# Patient Record
Sex: Male | Born: 1965 | Race: Black or African American | Hispanic: No | Marital: Married | Smoking: Current every day smoker
Health system: Southern US, Community
[De-identification: ages and names within clinical notes are randomized; demographics above are authoritative.]

## PROBLEM LIST (undated history)

## (undated) HISTORY — PX: SHOULDER SURGERY: SHX246

## (undated) HISTORY — PX: HERNIA REPAIR: SHX51

---

## 2017-06-22 ENCOUNTER — Encounter: Payer: Self-pay | Admitting: Emergency Medicine

## 2017-06-22 ENCOUNTER — Ambulatory Visit (INDEPENDENT_AMBULATORY_CARE_PROVIDER_SITE_OTHER): Payer: Self-pay

## 2017-06-22 ENCOUNTER — Ambulatory Visit
Admission: EM | Admit: 2017-06-22 | Discharge: 2017-06-22 | Disposition: A | Discharge status: Home / Self Care | Payer: Self-pay | Attending: Family Medicine | Admitting: Family Medicine

## 2017-06-22 DIAGNOSIS — L089 Local infection of the skin and subcutaneous tissue, unspecified: Secondary | ICD-10-CM

## 2017-06-22 DIAGNOSIS — M79671 Pain in right foot: Secondary | ICD-10-CM

## 2017-06-22 DIAGNOSIS — S90121A Contusion of right lesser toe(s) without damage to nail, initial encounter: Secondary | ICD-10-CM

## 2017-06-22 MED ORDER — SULFAMETHOXAZOLE-TRIMETHOPRIM 800-160 MG PO TABS: 1.0000 | ORAL_TABLET | Freq: Two times a day (BID) | ORAL | 0 refills | Status: AC

## 2017-06-22 MED ORDER — MELOXICAM 15 MG PO TABS: 15.0000 mg | ORAL_TABLET | Freq: Every day | ORAL | 0 refills | Status: AC | PRN

## 2017-06-22 MED ORDER — TRAMADOL HCL 50 MG PO TABS: 50.0000 mg | ORAL_TABLET | Freq: Three times a day (TID) | ORAL | 0 refills | Status: AC | PRN

## 2017-06-22 MED ORDER — MUPIROCIN 2 % EX OINT: TOPICAL_OINTMENT | CUTANEOUS | 0 refills | Status: AC

## 2017-06-22 NOTE — ED Provider Notes (Addendum)
[facility] ____________________________________________  Time seen: Approximately 5:38 PM  I have reviewed the triage vital signs and the nursing notes.   HISTORY  Chief Complaint Foot Pain (right foot)   HPI Carlos Paul is a 51 y.o. male  present for evaluation of right third fourth and fifth toe pain. Patient reports approximate 3-4 weeks ago he was walking in his house barefoot, and accidentally stubbed his toe on the corner causing immediate pain to the same areas. Patient reports he did have swelling that quickly happened, then the swelling somewhat improved and worsened again a few days later. Patient reports he has rested the area with elevation some but not consistently. Reports he is continue to stay active. States that he does have to work daily and we are still toed boots that he feels like has been rubbing the area causing more pain and irritation. Denies any paresthesia, pain radiation or other pain or injury. States no fall. States has occasionally taken over-the-counter ibuprofen without change. Occasionally soaked the foot, without change. Denies other alleviating measures. States pain is mostly with direct weightbearing and direct touch. States that he is sitting still and elevated foot, no pain. Denies history of same. Reports has fractured right ankle in the past, denies chronic problems are chronic ankle pain. Denies known break in skin. Denies insect bite, tick bite or tick attachment.  Denies chest pain, shortness of breath, abdominal pain, dysuria, extremity pain, extremity swelling or rash. Denies recent sickness. Denies recent antibiotic use. Denies cardiac history. Denies history of gout. Denies renal insufficiency. Denies history of MRSA. States last tetanus immunization 3 years ago.   PCP: Ori   History reviewed. No pertinent past medical history.  There are no active problems to display for this patient.   Past Surgical History:  Procedure  Laterality Date  . HERNIA REPAIR    . SHOULDER SURGERY Right      No current facility-administered medications for this encounter.   Current Outpatient Prescriptions:  .  meloxicam (MOBIC) 15 MG tablet, Take 1 tablet (15 mg total) by mouth daily as needed., Disp: 10 tablet, Rfl: 0 .  mupirocin ointment (BACTROBAN) 2 %, Apply two times a day for 7 days., Disp: 22 g, Rfl: 0 .  sulfamethoxazole-trimethoprim (BACTRIM DS,SEPTRA DS) 800-160 MG tablet, Take 1 tablet by mouth 2 (two) times daily., Disp: 14 tablet, Rfl: 0 .  traMADol (ULTRAM) 50 MG tablet, Take 1 tablet (50 mg total) by mouth every 8 (eight) hours as needed (Do not drive or operate machinery while taking as can cause drowsiness.)., Disp: 12 tablet, Rfl: 0  Allergies Patient has no known allergies.  History reviewed. No pertinent family history.  Social History Social History  Substance Use Topics  . Smoking status: Current Every Day Smoker    Types: Cigarettes  . Smokeless tobacco: Never Used  . Alcohol use Yes    Review of Systems Constitutional: No fever/chills Cardiovascular: Denies chest pain. Respiratory: Denies shortness of breath. Gastrointestinal: No abdominal pain.  No nausea, no vomiting.   Musculoskeletal: Negative for back pain. Skin: As above. No other skin changes.  Neurological: Negative for headaches, focal weakness or numbness.  ____________________________________________   PHYSICAL EXAM:  VITAL SIGNS: ED Triage Vitals  Enc Vitals Group     BP 06/22/17 1703 (!) 141/93     Pulse Rate 06/22/17 1703 81     Resp 06/22/17 1703 16     Temp 06/22/17 1703 98.5 F (36.9 C)  Temp Source 06/22/17 1703 Oral     SpO2 06/22/17 1703 99 %     Weight 06/22/17 1702 186 lb (84.4 kg)     Height 06/22/17 1702 5\' 9"  (1.753 m)     Head Circumference --      Peak Flow --      Pain Score 06/22/17 1702 8     Pain Loc --      Pain Edu? --      Excl. in GC? --     Constitutional: Alert and oriented. Well  appearing and in no acute distress. Cardiovascular: Normal rate, regular rhythm. Grossly normal heart sounds.  Good peripheral circulation. Respiratory: Normal respiratory effort without tachypnea nor retractions. Breath sounds are clear and equal bilaterally. No wheezes, rales, rhonchi. Musculoskeletal:  Steady gait. Bilateral pedal pulses equal and easily palpated. Except: Right proximal third fourth and fifth toes at the proximal toe and MTP joint mild to moderate tenderness to direct palpation with minimal ecchymosis and swelling, right fourth proximal phalanx toe mild swelling with mild localized erythema, skin appears intact, no fluctuance, no induration, no palpated abscess, full range of motion present, normal distal sensation and capillary refill to all toes, right lower extremity otherwise nontender.  Neurologic:  Normal speech and language. Speech is normal. No gait instability.  Skin:  Skin is warm, dry. Psychiatric: Mood and affect are normal. Speech and behavior are normal. Patient exhibits appropriate insight and judgment   ___________________________________________   LABS (all labs ordered are listed, but only abnormal results are displayed)  Labs Reviewed - No data to display  RADIOLOGY  Dg Foot Complete Right  Result Date: 06/22/2017 CLINICAL DATA:  51 y/o M; injury 3-4 weeks ago with foot pain. Pain is greatest in the fourth and fifth digits with swelling of the top of fourth metatarsophalangeal joint. EXAM: RIGHT FOOT COMPLETE - 3+ VIEW COMPARISON:  None. FINDINGS: There is no evidence of fracture or dislocation. Lisfranc alignment is maintained. Soft tissues are unremarkable. Plantar calcaneal enthesophyte. Osteophyte of the talar head neck junction may represent anterior ankle impingement. IMPRESSION: 1. No acute fracture or dislocation identified. 2. Plantar calcaneal enthesophyte. 3. Osteophyte near talar head neck junction may represent anterior ankle impingement.  Electronically Signed   By: Mitzi Hansen M.D.   On: 06/22/2017 17:30   ____________________________________________   PROCEDURES Procedures   Denies need for assistive shoe or crutches.   INITIAL IMPRESSION / ASSESSMENT AND PLAN / ED COURSE  Pertinent labs & imaging results that were available during my care of the patient were reviewed by me and considered in my medical decision making (see chart for details).  Well-appearing patient. No acute distress. Reports mechanical injury to right foot several weeks ago with continued pain after the injury. Right foot x-ray per radiologist no acute fracture or dislocation identified. Right fourth toe with erythema noted and concern for secondary infection, also discussed differential of gout. Discussed in detail with patient recommend supportive care. Will start patient empirically on oral Bactrim, topical Bactroban, oral Mobic and when necessary tramadol. Encouraged rest, elevation, buddy taping, keeping dry, avoidance of aggravation such as with steel toe boots, and monitoring. Discussed follow-up with podiatry or primary care as needed for continued complaints. Discussed indication, risks and benefits of medications with patient. Work note given for today. Encourage monitoring blood pressure as well as smoking cessation.  [place] controlled substance database reviewed, and no recent controlled medications documented.   Discussed follow up with Primary care physician this  week. Discussed follow up and return parameters including no resolution or any worsening concerns. Patient verbalized understanding and agreed to plan.   ____________________________________________   FINAL CLINICAL IMPRESSION(S) / ED DIAGNOSES  Final diagnoses:  Contusion of lesser toe of right foot without damage to nail, initial encounter  Right foot pain  Infection of skin of toes     Discharge Medication List as of 06/22/2017  6:01 PM    START  taking these medications   Details  meloxicam (MOBIC) 15 MG tablet Take 1 tablet (15 mg total) by mouth daily as needed., Starting Mon 06/22/2017, Normal    mupirocin ointment (BACTROBAN) 2 % Apply two times a day for 7 days., Normal    sulfamethoxazole-trimethoprim (BACTRIM DS,SEPTRA DS) 800-160 MG tablet Take 1 tablet by mouth 2 (two) times daily., Starting Mon 06/22/2017, Until Mon 06/29/2017, Normal    traMADol (ULTRAM) 50 MG tablet Take 1 tablet (50 mg total) by mouth every 8 (eight) hours as needed (Do not drive or operate machinery while taking as can cause drowsiness.)., Starting Mon 06/22/2017, Print        Note: This dictation was prepared with Dragon dictation along with smaller phrase technology. Any transcriptional errors that result from this process are unintentional.           Renford Dills, NP 06/22/17 [phone]

## 2017-06-22 NOTE — Discharge Instructions (Signed)
Take medication as prescribed. Keep clean. Buddy tape as discussed. Keep dry. Elevate. Monitor closely.    Follow up with your primary care physician or the above this week as needed. Return to Urgent care for new or worsening concerns.

## 2017-06-22 NOTE — ED Triage Notes (Signed)
Patient c/o pain in his right foot and right 5th toe for the past 3-4 weeks.  Patient states that he hit his right foot on something during the night.

## 2018-01-09 IMAGING — CR DG FOOT COMPLETE 3+V*R*
4 series · 4 of 4 positions shown · non-contrast
Comparison: None.

CLINICAL DATA: 51 y/o M; injury 3-4 weeks ago with foot pain. Pain
is greatest in the fourth and fifth digits with swelling of the top
of fourth metatarsophalangeal joint.

EXAM:
RIGHT FOOT COMPLETE - 3+ VIEW

[foot ap]
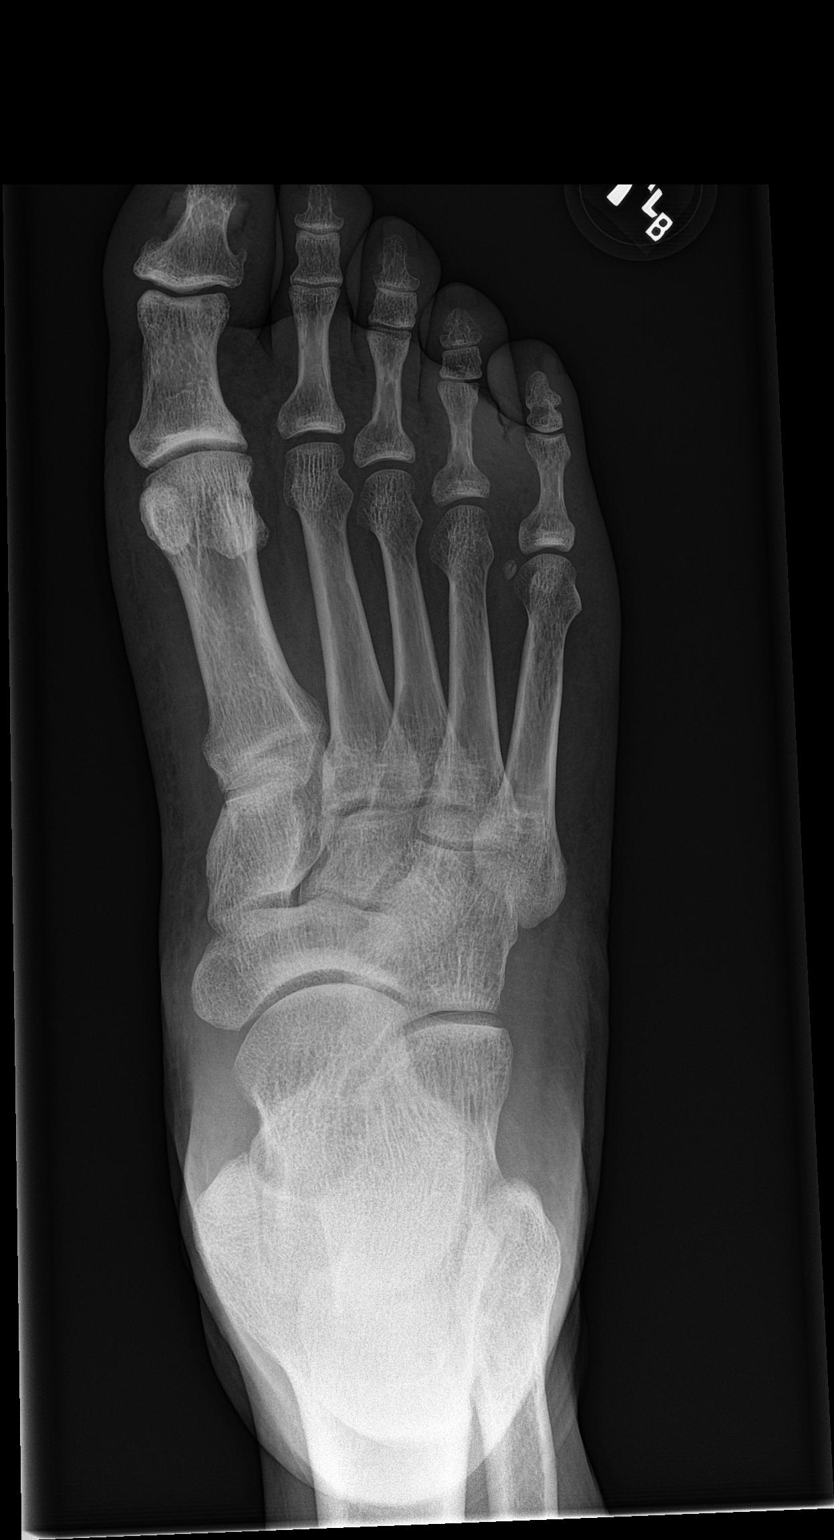

[foot obl]
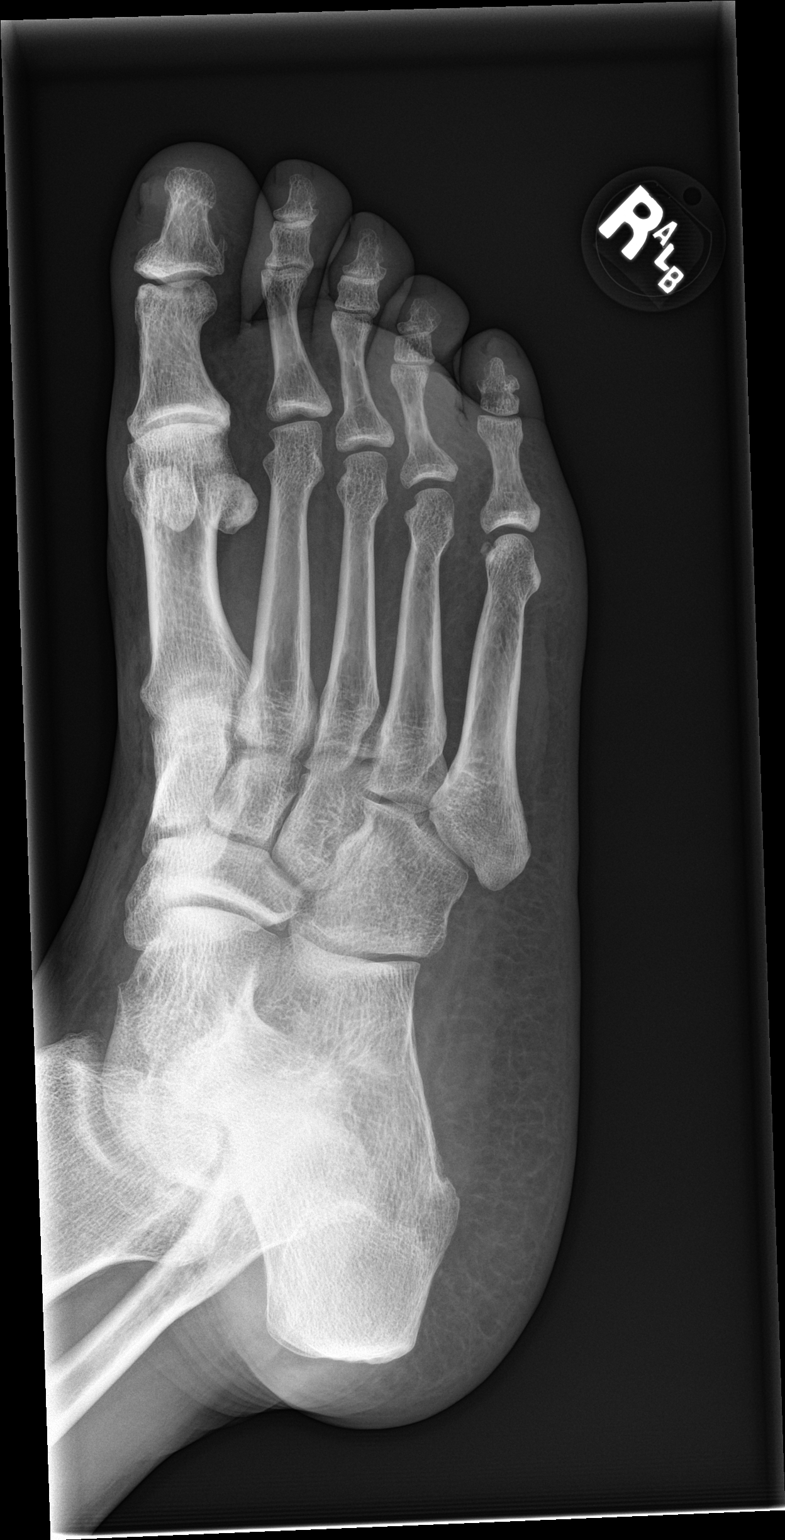

[foot lat (1 of 2)]
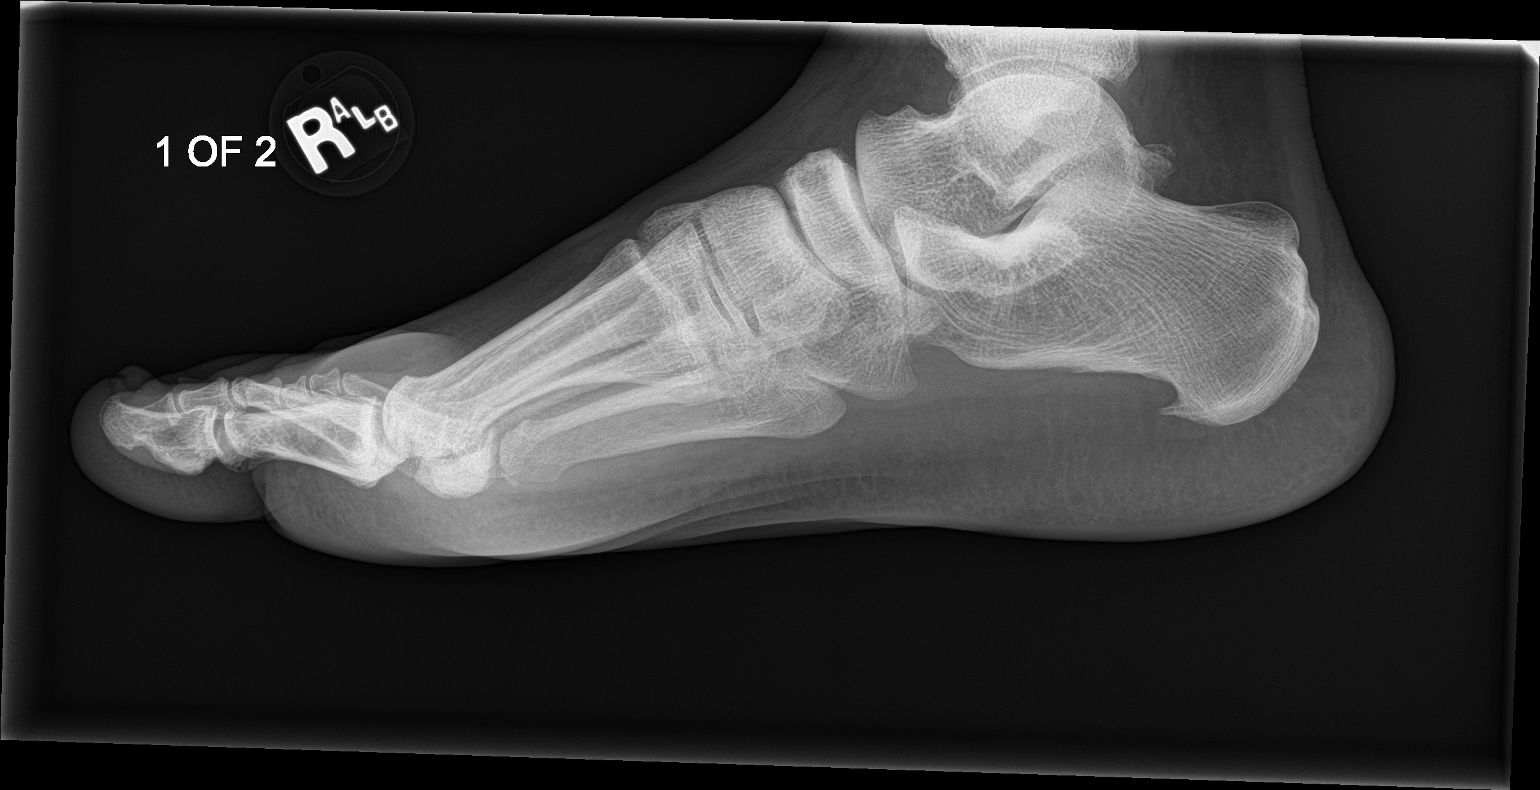

[foot lat (2 of 2)]
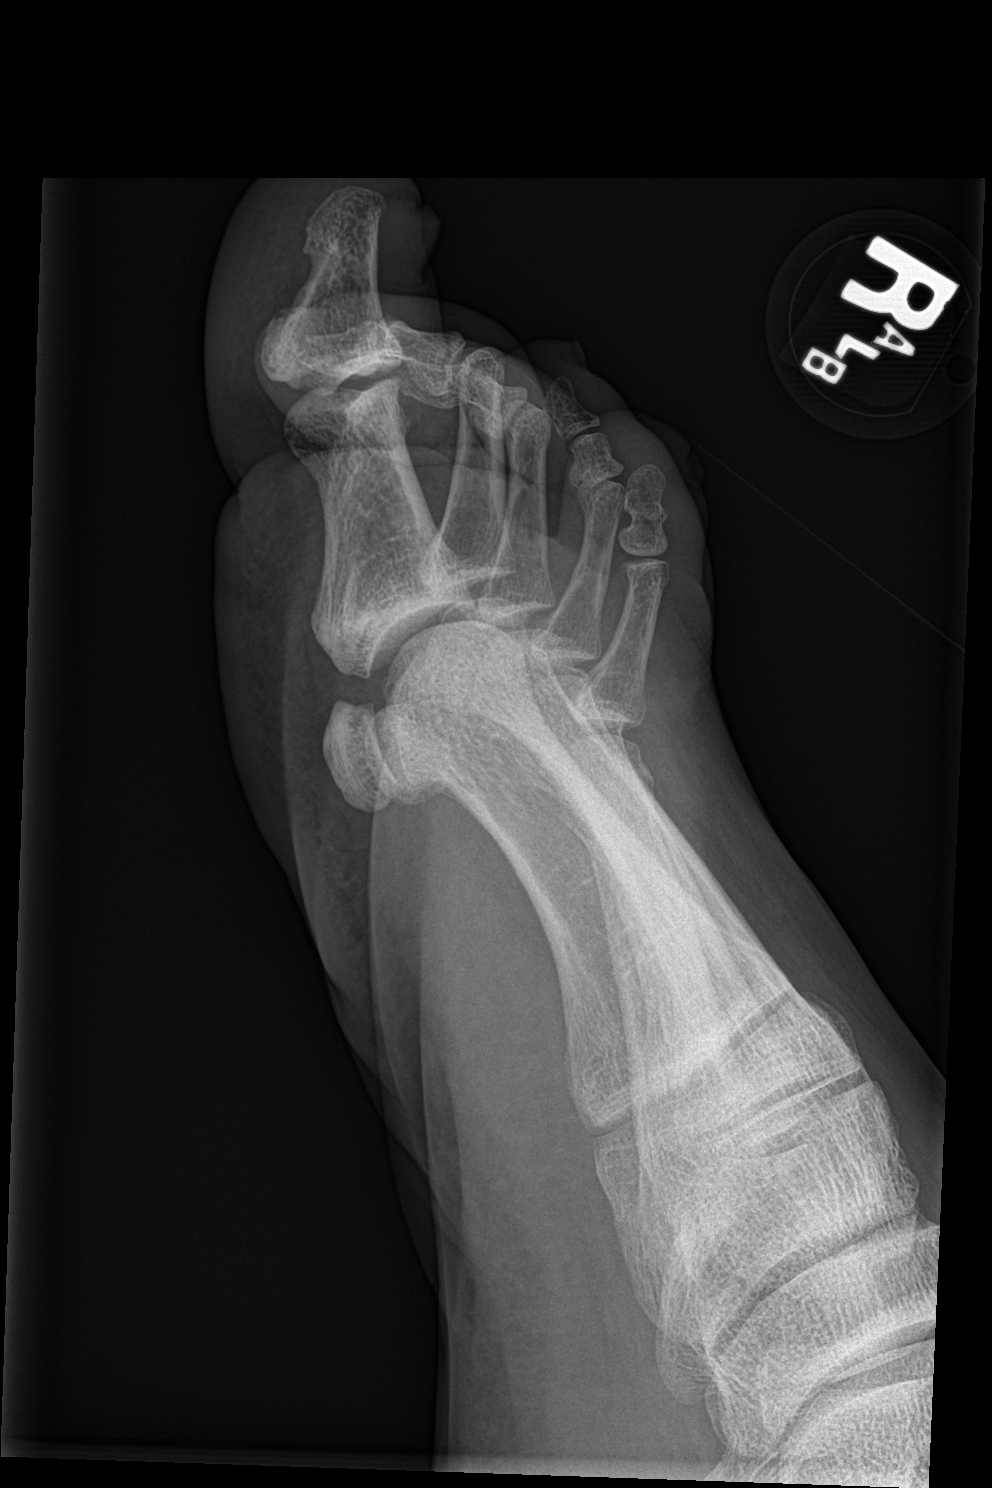

[4 of 4 positions shown; findings below may reference images not displayed]

FINDINGS: There is no evidence of fracture or dislocation. Lisfranc alignment
is maintained. Soft tissues are unremarkable. Plantar calcaneal
enthesophyte. Osteophyte of the talar head neck junction may
represent anterior ankle impingement.
IMPRESSION: 1. No acute fracture or dislocation identified.
2. Plantar calcaneal enthesophyte.
3. Osteophyte near talar head neck junction may represent anterior
ankle impingement.

By: Shabana Herdez M.D.
